# Patient Record
Sex: Male | Born: 1980 | Race: White | Hispanic: No | Marital: Married | Smoking: Never smoker
Health system: Southern US, Community
[De-identification: ages and names within clinical notes are randomized; demographics above are authoritative.]

## PROBLEM LIST (undated history)

## (undated) DIAGNOSIS — E669 Obesity, unspecified: Secondary | ICD-10-CM

---

## 2003-10-31 ENCOUNTER — Emergency Department (HOSPITAL_COMMUNITY): Admission: EM | Admit: 2003-10-31 | Discharge: 2003-10-31 | Payer: Self-pay | Admitting: Emergency Medicine

## 2003-11-12 ENCOUNTER — Emergency Department (HOSPITAL_COMMUNITY): Admission: EM | Admit: 2003-11-12 | Discharge: 2003-11-12 | Payer: Self-pay | Admitting: *Deleted

## 2005-02-16 ENCOUNTER — Ambulatory Visit (HOSPITAL_COMMUNITY): Admission: RE | Admit: 2005-02-16 | Discharge: 2005-02-16 | Payer: Self-pay | Admitting: Family Medicine

## 2021-09-16 ENCOUNTER — Other Ambulatory Visit: Payer: Self-pay

## 2021-09-16 ENCOUNTER — Emergency Department (INDEPENDENT_AMBULATORY_CARE_PROVIDER_SITE_OTHER): Payer: Self-pay

## 2021-09-16 ENCOUNTER — Emergency Department
Admission: EM | Admit: 2021-09-16 | Discharge: 2021-09-16 | Disposition: A | Discharge status: Home / Self Care | Payer: Self-pay | Source: Home / Self Care | Attending: Emergency Medicine | Admitting: Emergency Medicine

## 2021-09-16 DIAGNOSIS — R059 Cough, unspecified: Secondary | ICD-10-CM

## 2021-09-16 DIAGNOSIS — R051 Acute cough: Secondary | ICD-10-CM

## 2021-09-16 DIAGNOSIS — J0110 Acute frontal sinusitis, unspecified: Secondary | ICD-10-CM

## 2021-09-16 HISTORY — DX: Obesity, unspecified: E66.9

## 2021-09-16 MED ORDER — AEROCHAMBER PLUS MISC: 2 refills | Status: AC

## 2021-09-16 MED ORDER — ALBUTEROL SULFATE HFA 108 (90 BASE) MCG/ACT IN AERS: 1.0000 | INHALATION_SPRAY | RESPIRATORY_TRACT | 0 refills | Status: AC | PRN

## 2021-09-16 MED ORDER — BENZONATATE 200 MG PO CAPS: 200.0000 mg | ORAL_CAPSULE | Freq: Three times a day (TID) | ORAL | 0 refills | Status: AC | PRN

## 2021-09-16 MED ORDER — FLUTICASONE PROPIONATE 50 MCG/ACT NA SUSP: 2.0000 | Freq: Every day | NASAL | 0 refills | Status: AC

## 2021-09-16 MED ORDER — AMOXICILLIN-POT CLAVULANATE 875-125 MG PO TABS: 1.0000 | ORAL_TABLET | Freq: Two times a day (BID) | ORAL | 0 refills | Status: AC

## 2021-09-16 NOTE — ED Provider Notes (Addendum)
HPI  SUBJECTIVE:  Jim Burke is a 40 y.o. male who presents with 3 weeks of cough, nasal congestion, chest congestion that is not getting any better.  He reports yellowish clear rhinorrhea, sinus pain pressure, postnasal drip, wheezing, shortness of breath at times, dyspnea with exertion.  No fevers, facial swelling, dental pain, chest pain.  No antibiotics in the past 3 months.  No antipyretic in the past 6 hours.  He is unable to sleep at night secondary to the cough.  No GERD symptoms.  He has been taking DayQuil and cough drops with improvement in his symptoms.  Symptoms are worse with strenuous walking.  He has a past medical history of COVID in October 2020.  PMD: None.  Past Medical History:  Diagnosis Date   Obesity     Past Surgical History:  Procedure Laterality Date   CHOLECYSTECTOMY     LEG SURGERY      Family History  Problem Relation Age of Onset   Healthy Mother    Cancer Father     Social History   Tobacco Use   Smoking status: Never   Smokeless tobacco: Never  Vaping Use   Vaping Use: Never used  Substance Use Topics   Alcohol use: Yes    Comment: rarely   Drug use: Never    No current facility-administered medications for this encounter.  Current Outpatient Medications:    albuterol (VENTOLIN HFA) 108 (90 Base) MCG/ACT inhaler, Inhale 1-2 puffs into the lungs every 4 (four) hours as needed for wheezing or shortness of breath., Disp: 1 each, Rfl: 0   amoxicillin-clavulanate (AUGMENTIN) 875-125 MG tablet, Take 1 tablet by mouth 2 (two) times daily. X 7 days, Disp: 14 tablet, Rfl: 0   benzonatate (TESSALON) 200 MG capsule, Take 1 capsule (200 mg total) by mouth 3 (three) times daily as needed for cough., Disp: 30 capsule, Rfl: 0   fluticasone (FLONASE) 50 MCG/ACT nasal spray, Place 2 sprays into both nostrils daily., Disp: 16 g, Rfl: 0   Pseudoephedrine-APAP-DM (DAYQUIL MULTI-SYMPTOM COLD/FLU PO), Take by mouth., Disp: , Rfl:    Spacer/Aero-Holding  Chambers (AEROCHAMBER PLUS) inhaler, Use with inhaler, Disp: 1 each, Rfl: 2  Allergies  Allergen Reactions   Codeine Other (See Comments)    Pt. Reports irritability.  Pt. Reports irritability.       ROS  As noted in HPI.   Physical Exam  BP 126/89 (BP Location: Right Arm)    Pulse 76    Temp 97.7 F (36.5 C) (Oral)    Resp 20    Ht 6\' 7"  (2.007 m)    Wt (!) 165.6 kg    SpO2 98%    BMI 41.12 kg/m   Constitutional: Well developed, well nourished, no acute distress Eyes:  EOMI, conjunctiva normal bilaterally HENT: Normocephalic, atraumatic,mucus membranes moist.  Positive frontal sinus tenderness.  No nasal congestion.  Normal turbinates.  No obvious postnasal drip Respiratory: Normal inspiratory effort, lungs clear bilaterally.  Positive chest wall tenderness Cardiovascular: Normal rate, regular rhythm, no murmurs rubs or gallop GI: nondistended skin: No rash, skin intact Musculoskeletal: no deformities Neurologic: Alert & oriented x 3, no focal neuro deficits Psychiatric: Speech and behavior appropriate   ED Course   Medications - No data to display  Orders Placed This Encounter  Procedures   DG Chest 2 View    Standing Status:   Standing    Number of Occurrences:   1    Order Specific Question:  Reason for Exam (SYMPTOM  OR DIAGNOSIS REQUIRED)    Answer:   Cough 3 weeks rule out pneumonia    No results found for this or any previous visit (from the past 24 hour(s)). DG Chest 2 View  Result Date: 09/16/2021 CLINICAL DATA:  Cough. EXAM: CHEST - 2 VIEW COMPARISON:  None. FINDINGS: The heart size and mediastinal contours are within normal limits. Both lungs are clear. The visualized skeletal structures are unremarkable. IMPRESSION: No active cardiopulmonary disease. Electronically Signed   By: Lupita Raider M.D.   On: 09/16/2021 09:55    ED Clinical Impression  1. Acute non-recurrent frontal sinusitis   2. Acute cough      ED Assessment/Plan  Checking  chest x-ray due to duration of symptoms.  If positive for pneumonia, he will need repeat imaging in 3 to 4 weeks to ensure resolution.  Plan to treat as a sinusitis with Augmentin, Flonase, saline nasal irrigation.  will send him home with a albuterol inhaler with a spacer scheduled dosing for the next 4 days, then as needed.  Tessalon for the cough.  Reviewed imaging independently.  No pneumonia.  Normal chest x-ray.  See radiology report for full details.  Chest x-ray negative for pneumonia.  Plan as above.  Will provide primary care list and order assistance in finding a PMD for routine care.  Discussed imaging, MDM, treatment plan, and plan for follow-up with patient. Discussed sn/sx that should prompt return to the ED. agrees with plan.   Meds ordered this encounter  Medications   fluticasone (FLONASE) 50 MCG/ACT nasal spray    Sig: Place 2 sprays into both nostrils daily.    Dispense:  16 g    Refill:  0   albuterol (VENTOLIN HFA) 108 (90 Base) MCG/ACT inhaler    Sig: Inhale 1-2 puffs into the lungs every 4 (four) hours as needed for wheezing or shortness of breath.    Dispense:  1 each    Refill:  0   Spacer/Aero-Holding Chambers (AEROCHAMBER PLUS) inhaler    Sig: Use with inhaler    Dispense:  1 each    Refill:  2    Please educate patient on use   benzonatate (TESSALON) 200 MG capsule    Sig: Take 1 capsule (200 mg total) by mouth 3 (three) times daily as needed for cough.    Dispense:  30 capsule    Refill:  0   amoxicillin-clavulanate (AUGMENTIN) 875-125 MG tablet    Sig: Take 1 tablet by mouth 2 (two) times daily. X 7 days    Dispense:  14 tablet    Refill:  0      *This clinic note was created using Scientist, clinical (histocompatibility and immunogenetics). Therefore, there may be occasional mistakes despite careful proofreading.  ?    Domenick Gong, MD 09/17/21 [id number]    Domenick Gong, MD 09/17/21 [phone]

## 2021-09-16 NOTE — Discharge Instructions (Addendum)
Chest x-ray was negative for pneumonia.  I am treating you for a sinusitis.  Finish the Augmentin even if you feel better, Flonase, Mucinex D, saline nasal irrigation with a [employer] sinus rinse and distilled water as often as you want.  Puffs from your albuterol inhaler every 4 hours for 2 days, then every 6 hours for 2 days, then as needed.  You may back off on the albuterol if you start to feel better.  Tessalon for the cough.  Below is a list of primary care practices who are taking new patients for you to follow-up with.  [facility] -multiple locations.  See website at [email or web address]  [facility] internal medicine clinic Ground Floor - [facility], [address] [place], [place], [place] [address] [phone]  [facility] at [facility] [address] [place], [place] [address] [phone]  [facility] [address] [place], [place] [address] [phone]  [facility] [phone] [address] [place] [address]  [facility]: [address] [place] [address]  [phone]  [facility]: [address] [place] [address]  [phone]  [place] [facility] : [address] [place] [address] [phone]  [facility]: [address] [place] [address] [phone]  [facility]: [address] [place] [address] [phone]  Dr. Oneal Grout [address] [facility] [place] [address]  [phone]  Go to www.goodrx.com  or www.costplusdrugs.com to look up your medications. This will give you a list of where you can find your prescriptions at the most affordable prices. Or ask the pharmacist what the cash price is, or if they have any  other discount programs available to help make your medication more affordable. This can be less expensive than what you would pay with insurance.

## 2021-09-16 NOTE — ED Triage Notes (Signed)
Pt presents to Urgent Care with c/o cough and nasal congestion x 3 weeks. Afebrile. Did not do COVID test; pt vaccinated.

## 2022-11-03 IMAGING — DX DG CHEST 2V
2 series · 2 of 2 positions shown · non-contrast
Comparison: None.

CLINICAL DATA: Cough.

EXAM:
CHEST - 2 VIEW

[chest pa]
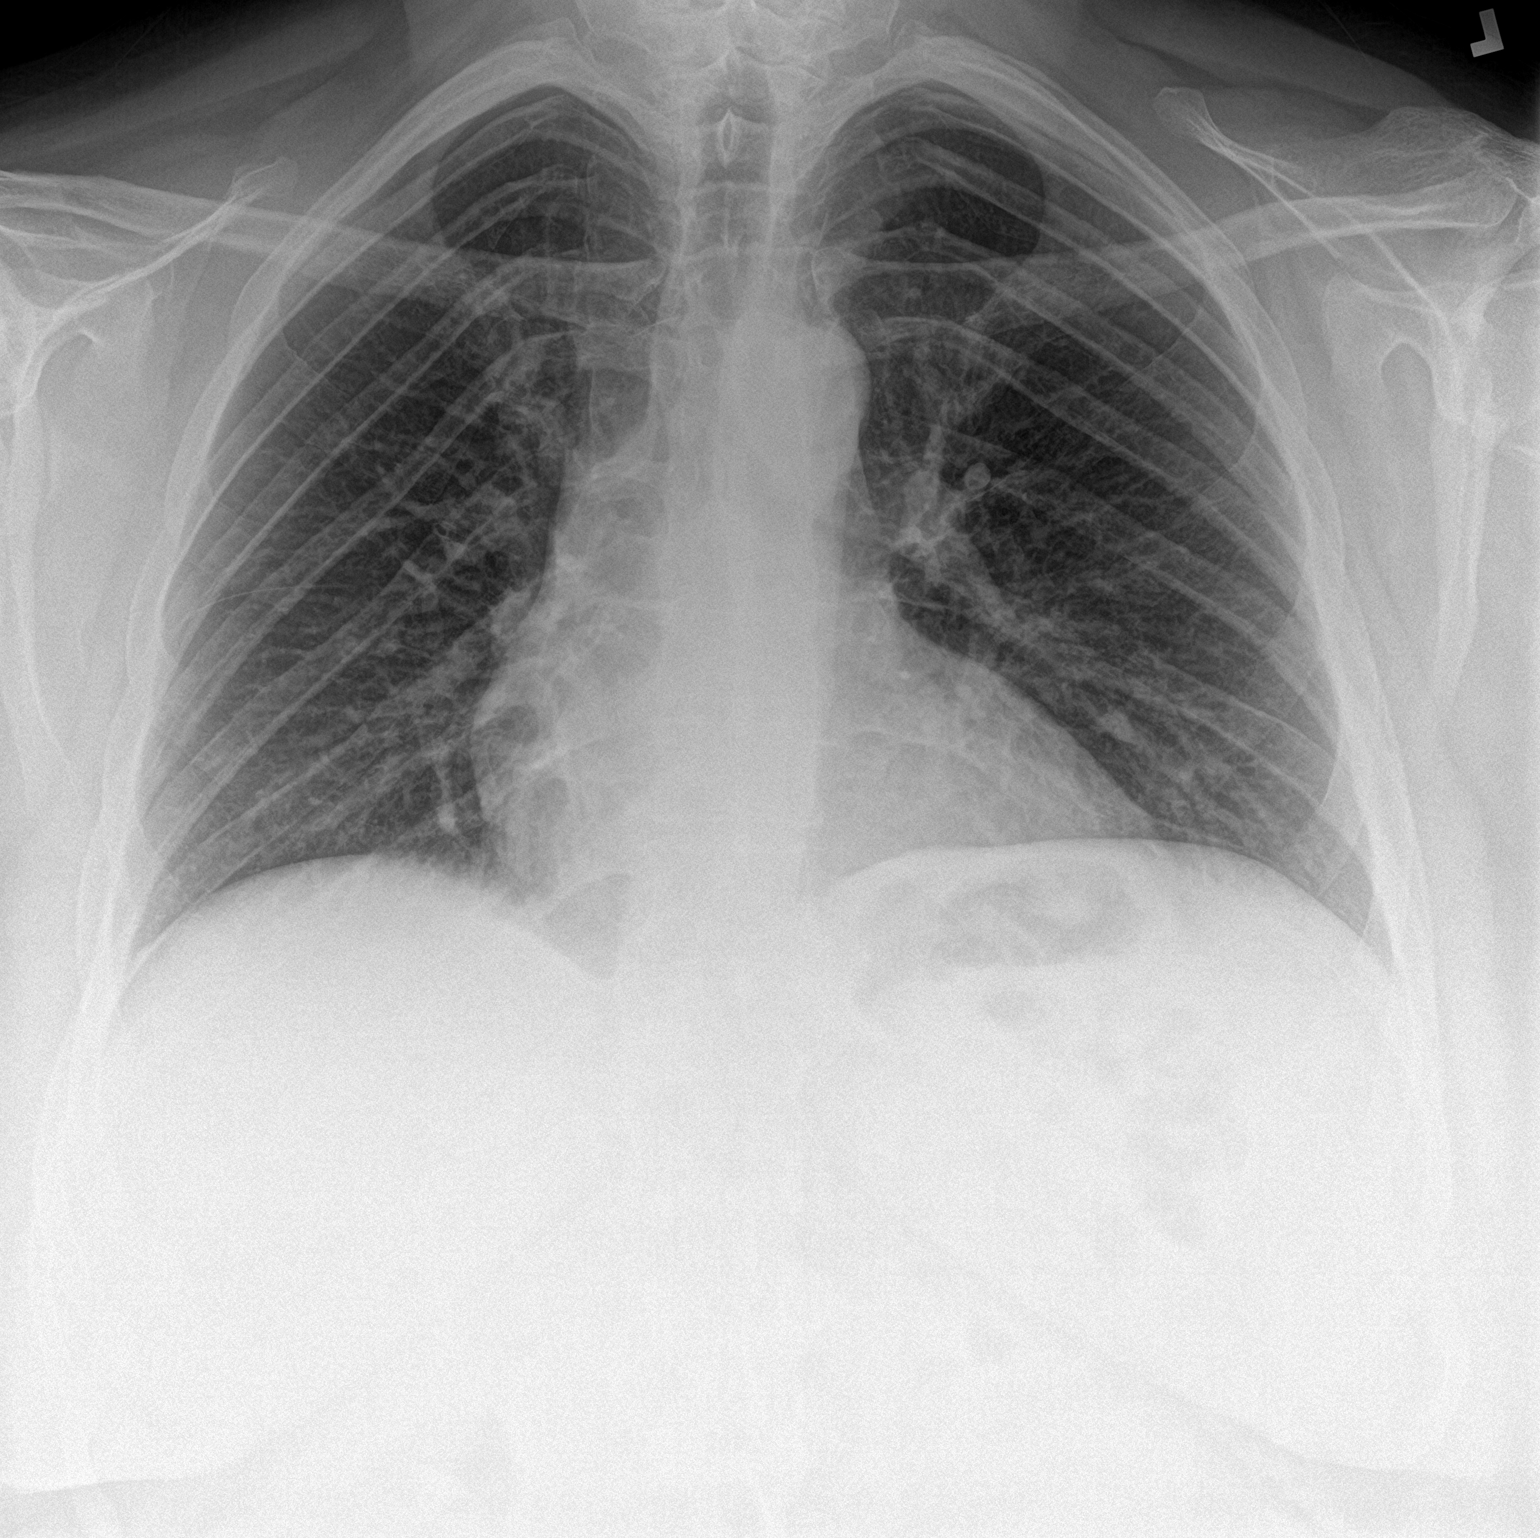

[chest lat]
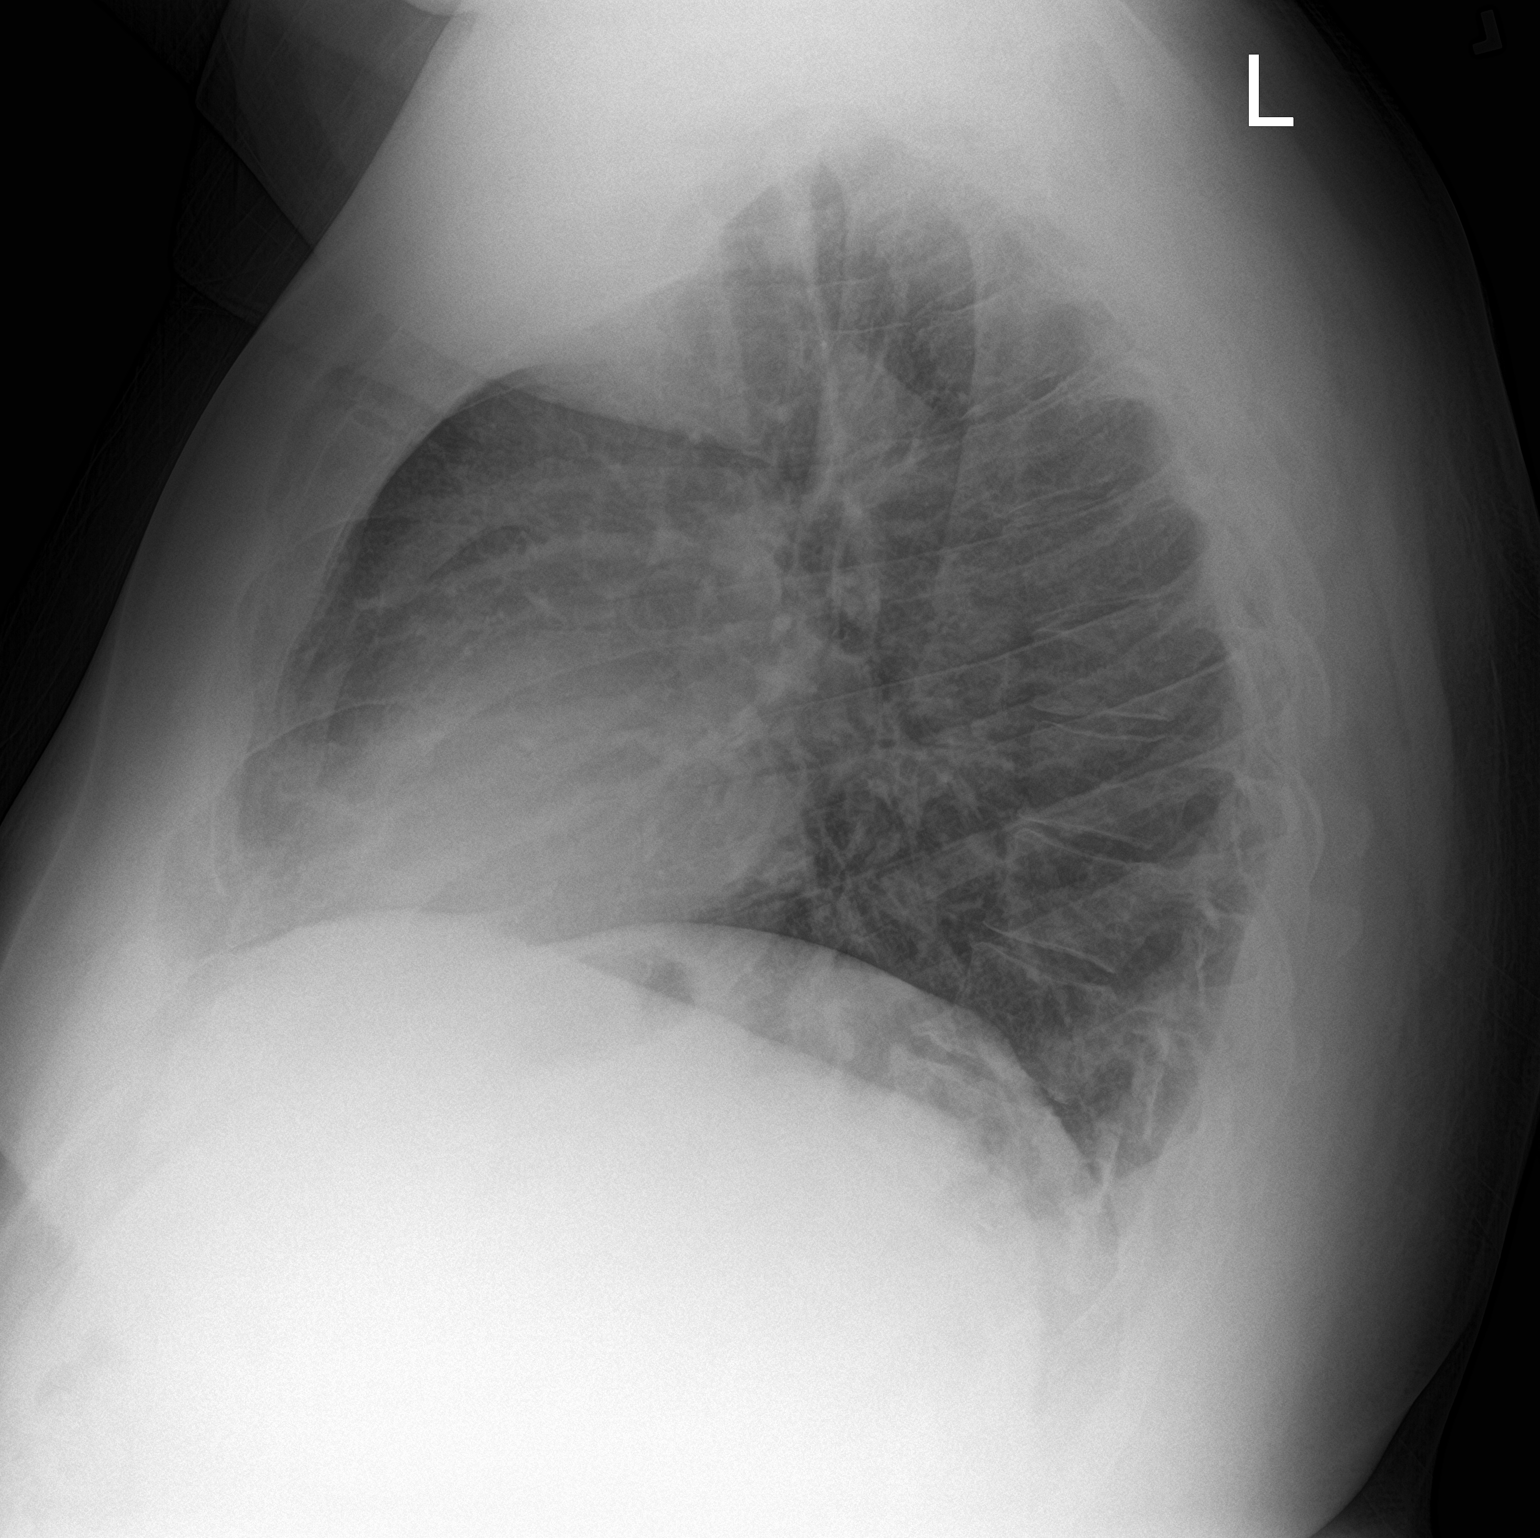

[2 of 2 positions shown; findings below may reference images not displayed]

FINDINGS: The heart size and mediastinal contours are within normal limits.
Both lungs are clear. The visualized skeletal structures are
unremarkable.
IMPRESSION: No active cardiopulmonary disease.
